# Patient Record
Sex: Female | Born: 1935 | Race: White | Hispanic: No | Marital: Married | Smoking: Former smoker
Health system: Southern US, Community
[De-identification: ages and names within clinical notes are randomized; demographics above are authoritative.]

## PROBLEM LIST (undated history)

## (undated) DIAGNOSIS — I2510 Atherosclerotic heart disease of native coronary artery without angina pectoris: Secondary | ICD-10-CM

## (undated) DIAGNOSIS — E2740 Unspecified adrenocortical insufficiency: Secondary | ICD-10-CM

## (undated) DIAGNOSIS — F419 Anxiety disorder, unspecified: Secondary | ICD-10-CM

## (undated) DIAGNOSIS — I4891 Unspecified atrial fibrillation: Secondary | ICD-10-CM

## (undated) HISTORY — PX: TONSILLECTOMY: SUR1361

## (undated) HISTORY — PX: ABDOMINAL HYSTERECTOMY: SHX81

## (undated) HISTORY — PX: APPENDECTOMY: SHX54

---

## 1998-06-10 ENCOUNTER — Inpatient Hospital Stay (HOSPITAL_COMMUNITY): Admission: RE | Admit: 1998-06-10 | Discharge: 1998-06-14 | Payer: Self-pay | Admitting: *Deleted

## 2015-11-18 ENCOUNTER — Emergency Department (HOSPITAL_COMMUNITY): Payer: Medicare Other

## 2015-11-18 ENCOUNTER — Encounter (HOSPITAL_COMMUNITY): Payer: Self-pay

## 2015-11-18 ENCOUNTER — Emergency Department (HOSPITAL_COMMUNITY)
Admission: EM | Admit: 2015-11-18 | Discharge: 2015-11-18 | Disposition: A | Discharge status: Home / Self Care | Payer: Medicare Other | Attending: Emergency Medicine | Admitting: Emergency Medicine

## 2015-11-18 DIAGNOSIS — B349 Viral infection, unspecified: Secondary | ICD-10-CM

## 2015-11-18 DIAGNOSIS — Z87891 Personal history of nicotine dependence: Secondary | ICD-10-CM | POA: Insufficient documentation

## 2015-11-18 DIAGNOSIS — Z7982 Long term (current) use of aspirin: Secondary | ICD-10-CM | POA: Diagnosis not present

## 2015-11-18 DIAGNOSIS — Z7952 Long term (current) use of systemic steroids: Secondary | ICD-10-CM | POA: Insufficient documentation

## 2015-11-18 DIAGNOSIS — I2510 Atherosclerotic heart disease of native coronary artery without angina pectoris: Secondary | ICD-10-CM | POA: Diagnosis not present

## 2015-11-18 DIAGNOSIS — Z8639 Personal history of other endocrine, nutritional and metabolic disease: Secondary | ICD-10-CM | POA: Insufficient documentation

## 2015-11-18 DIAGNOSIS — Z8659 Personal history of other mental and behavioral disorders: Secondary | ICD-10-CM | POA: Insufficient documentation

## 2015-11-18 DIAGNOSIS — Z79899 Other long term (current) drug therapy: Secondary | ICD-10-CM | POA: Insufficient documentation

## 2015-11-18 DIAGNOSIS — R509 Fever, unspecified: Secondary | ICD-10-CM | POA: Diagnosis present

## 2015-11-18 DIAGNOSIS — I4891 Unspecified atrial fibrillation: Secondary | ICD-10-CM | POA: Insufficient documentation

## 2015-11-18 HISTORY — DX: Atherosclerotic heart disease of native coronary artery without angina pectoris: I25.10

## 2015-11-18 HISTORY — DX: Anxiety disorder, unspecified: F41.9

## 2015-11-18 HISTORY — DX: Unspecified adrenocortical insufficiency: E27.40

## 2015-11-18 HISTORY — DX: Unspecified atrial fibrillation: I48.91

## 2015-11-18 LAB — CBC WITH DIFFERENTIAL/PLATELET
Basophils Absolute: 0 10*3/uL (ref 0.0–0.1)
Basophils Relative: 0 %
Eosinophils Absolute: 0 10*3/uL (ref 0.0–0.7)
Eosinophils Relative: 0 %
HEMATOCRIT: 43.6 % (ref 36.0–46.0)
HEMOGLOBIN: 14.5 g/dL (ref 12.0–15.0)
LYMPHS ABS: 0.9 10*3/uL (ref 0.7–4.0)
LYMPHS PCT: 6 %
MCH: 30.4 pg (ref 26.0–34.0)
MCHC: 33.3 g/dL (ref 30.0–36.0)
MCV: 91.4 fL (ref 78.0–100.0)
MONO ABS: 1.3 10*3/uL — AB (ref 0.1–1.0)
MONOS PCT: 9 %
NEUTROS ABS: 12 10*3/uL — AB (ref 1.7–7.7)
NEUTROS PCT: 85 %
Platelets: 171 10*3/uL (ref 150–400)
RBC: 4.77 MIL/uL (ref 3.87–5.11)
RDW: 13.5 % (ref 11.5–15.5)
WBC: 14.2 10*3/uL — ABNORMAL HIGH (ref 4.0–10.5)

## 2015-11-18 LAB — COMPREHENSIVE METABOLIC PANEL
ALBUMIN: 3.5 g/dL (ref 3.5–5.0)
ALT: 18 U/L (ref 14–54)
AST: 34 U/L (ref 15–41)
Alkaline Phosphatase: 66 U/L (ref 38–126)
Anion gap: 11 (ref 5–15)
BILIRUBIN TOTAL: 1.1 mg/dL (ref 0.3–1.2)
BUN: 21 mg/dL — AB (ref 6–20)
CO2: 23 mmol/L (ref 22–32)
Calcium: 8.6 mg/dL — ABNORMAL LOW (ref 8.9–10.3)
Chloride: 97 mmol/L — ABNORMAL LOW (ref 101–111)
Creatinine, Ser: 0.72 mg/dL (ref 0.44–1.00)
GFR calc Af Amer: >60 mL/min (ref >60)
GFR calc non Af Amer: >60 mL/min (ref >60)
GLUCOSE: 157 mg/dL — AB (ref 65–99)
POTASSIUM: 4.5 mmol/L (ref 3.5–5.1)
SODIUM: 131 mmol/L — AB (ref 135–145)
TOTAL PROTEIN: 6.5 g/dL (ref 6.5–8.1)

## 2015-11-18 LAB — URINE MICROSCOPIC-ADD ON

## 2015-11-18 LAB — URINALYSIS, ROUTINE W REFLEX MICROSCOPIC
GLUCOSE, UA: NEGATIVE mg/dL
Ketones, ur: 15 mg/dL — AB
Nitrite: POSITIVE — AB
PH: 5.5 (ref 5.0–8.0)
PROTEIN: 30 mg/dL — AB
SPECIFIC GRAVITY, URINE: 1.03 (ref 1.005–1.030)

## 2015-11-18 LAB — I-STAT CG4 LACTIC ACID, ED
LACTIC ACID, VENOUS: 1.24 mmol/L (ref 0.5–2.0)
Lactic Acid, Venous: 1.44 mmol/L (ref 0.5–2.0)

## 2015-11-18 MED ORDER — SODIUM CHLORIDE 0.9 % IV BOLUS (SEPSIS)
1000.0000 mL | Freq: Once | INTRAVENOUS | Status: AC
  Administered 2015-11-18: 1000 mL via INTRAVENOUS

## 2015-11-18 MED ORDER — SODIUM CHLORIDE 0.9 % IV SOLN
INTRAVENOUS | Status: DC
  Administered 2015-11-18: 16:00:00 via INTRAVENOUS

## 2015-11-18 NOTE — Discharge Instructions (Signed)
Viral Infections °A viral infection can be caused by different types of viruses. Most viral infections are not serious and resolve on their own. However, some infections may cause severe symptoms and may lead to further complications. °SYMPTOMS °Viruses can frequently cause: °· Minor sore throat. °· Aches and pains. °· Headaches. °· Runny nose. °· Different types of rashes. °· Watery eyes. °· Tiredness. °· Cough. °· Loss of appetite. °· Gastrointestinal infections, resulting in nausea, vomiting, and diarrhea. °These symptoms do not respond to antibiotics because the infection is not caused by bacteria. However, you might catch a bacterial infection following the viral infection. This is sometimes called a "superinfection." Symptoms of such a bacterial infection may include: °· Worsening sore throat with pus and difficulty swallowing. °· Swollen neck glands. °· Chills and a high or persistent fever. °· Severe headache. °· Tenderness over the sinuses. °· Persistent overall ill feeling (malaise), muscle aches, and tiredness (fatigue). °· Persistent cough. °· Yellow, green, or brown mucus production with coughing. °HOME CARE INSTRUCTIONS  °· Only take over-the-counter or prescription medicines for pain, discomfort, diarrhea, or fever as directed by your caregiver. °· Drink enough water and fluids to keep your urine clear or pale yellow. Sports drinks can provide valuable electrolytes, sugars, and hydration. °· Get plenty of rest and maintain proper nutrition. Soups and broths with crackers or rice are fine. °SEEK IMMEDIATE MEDICAL CARE IF:  °· You have severe headaches, shortness of breath, chest pain, neck pain, or an unusual rash. °· You have uncontrolled vomiting, diarrhea, or you are unable to keep down fluids. °· You or your child has an oral temperature above 102° F (38.9° C), not controlled by medicine. °· Your baby is older than 3 months with a rectal temperature of 102° F (38.9° C) or higher. °· Your baby is 3  months old or younger with a rectal temperature of 100.4° F (38° C) or higher. °MAKE SURE YOU:  °· Understand these instructions. °· Will watch your condition. °· Will get help right away if you are not doing well or get worse. °  °This information is not intended to replace advice given to you by your health care provider. Make sure you discuss any questions you have with your health care provider. °  °Document Released: 06/10/2005 Document Revised: 11/23/2011 Document Reviewed: 02/06/2015 °Elsevier Interactive Patient Education ©2016 [employer] ° °

## 2015-11-18 NOTE — ED Notes (Addendum)
Per EMS, Pt, from [facility], c/o fever, decreased oral intake, emesis, and elevated WBC x 2 days.  Denies pain.  Denies diarrhea.  Hx of adrenocortical insufficiency.      Pt had blood work drawn which resulted WBC 15.  Also, Pt negative for Flu A and B.

## 2015-11-18 NOTE — ED Provider Notes (Signed)
CSN: [id number]     Arrival date & time 11/18/15  1340 History   First MD Initiated Contact with Patient 11/18/15 1525     Chief Complaint  Patient presents with  . Fever  . Abnormal Lab     (Consider location/radiation/quality/duration/timing/severity/associated sxs/prior Treatment) HPI Comments: Patient here with 3 days of low-grade fever as well as decreased oral intake. Negative influenza test at the nursing home. Her blood work showed elevated white blood cell count of 15,000. She has has not cough and congestion. As well as a hoarse voice. Denies any dysuria or flank pain. No chest discomfort. Patient states that she feels as if she is getting better. Her temperature today of 102 and was treated with Tylenol. Was transported from the nursing home to here.  Patient is a 82 y.o. female presenting with fever. The history is provided by the patient and a relative.  Fever   Past Medical History  Diagnosis Date  . Atrial fibrillation (HCC)   . Anxiety disorder   . ASHD (arteriosclerotic heart disease)   . Adrenocortical insufficiency [facility](HCC)    Past Surgical History  Procedure Laterality Date  . Abdominal hysterectomy    . Appendectomy    . Tonsillectomy     History reviewed. No pertinent family history. Social History  Substance Use Topics  . Smoking status: Former Games developermoker  . Smokeless tobacco: None  . Alcohol Use: No   OB History    No data available     Review of Systems  Constitutional: Positive for fever.  All other systems reviewed and are negative.     Allergies  Ace inhibitors  Home Medications   Prior to Admission medications   Medication Sig Start Date End Date Taking? Authorizing Provider  acetaminophen (TYLENOL) 325 MG tablet Take 650 mg by mouth every 6 (six) hours as needed (for pain).   Yes Historical Provider, MD  aspirin 81 MG chewable tablet Chew 81 mg by mouth daily.   Yes Historical Provider, MD  bisacodyl (DULCOLAX) 5 MG EC tablet Take 5 mg by  mouth daily as needed for mild constipation, moderate constipation or severe constipation.   Yes Historical Provider, MD  Calcium Carbonate-Vitamin D (CALCIUM 600+D) 600-400 MG-UNIT tablet Take 1 tablet by mouth daily.   Yes Historical Provider, MD  diphenhydrAMINE (BENADRYL) 25 mg capsule Take 25 mg by mouth every 6 (six) hours as needed for itching.   Yes Historical Provider, MD  fludrocortisone (FLORINEF) 0.1 MG tablet Take 0.1 mg by mouth daily.   Yes Historical Provider, MD  levothyroxine (SYNTHROID, LEVOTHROID) 88 MCG tablet Take 88 mcg by mouth daily before breakfast.   Yes Historical Provider, MD  losartan (COZAAR) 50 MG tablet Take 50 mg by mouth every evening.   Yes Historical Provider, MD  Melatonin 3 MG TABS Take 1 tablet by mouth every evening.   Yes Historical Provider, MD  metoprolol (LOPRESSOR) 50 MG tablet Take 75 mg by mouth 2 (two) times daily.   Yes Historical Provider, MD  nitroGLYCERIN (NITROSTAT) 0.4 MG SL tablet Place 0.4 mg under the tongue every 5 (five) minutes as needed for chest pain.   Yes Historical Provider, MD  oxyCODONE (OXY IR/ROXICODONE) 5 MG immediate release tablet Take 5 mg by mouth every 4 (four) hours as needed for severe pain.   Yes Historical Provider, MD  potassium chloride SA (K-DUR,KLOR-CON) 20 MEQ tablet Take 20 mEq by mouth daily.   Yes Historical Provider, MD  predniSONE (DELTASONE) 2.5 MG tablet  Take 12.5 mg by mouth daily with breakfast.   Yes Historical Provider, MD  senna-docusate (SENEXON-S) 8.6-50 MG tablet Take 2 tablets by mouth daily as needed for mild constipation.   Yes Historical Provider, MD   BP 122/81 mmHg  Pulse 88  Temp(Src) 98 F (36.7 C) (Oral)  Resp 16  Ht  (1.676 m)  Wt 47.628 kg  BMI 16.96 kg/m2  SpO2 94% Physical Exam  Constitutional: She is oriented to person, place, and time. She appears well-developed and well-nourished.  Non-toxic appearance. No distress.  HENT:  Head: Normocephalic and atraumatic.  Eyes:  Conjunctivae, EOM and lids are normal. Pupils are equal, round, and reactive to light.  Neck: Normal range of motion. Neck supple. No tracheal deviation present. No thyroid mass present.  Cardiovascular: Normal rate, regular rhythm and normal heart sounds.  Exam reveals no gallop.   No murmur heard. Pulmonary/Chest: Effort normal and breath sounds normal. No stridor. No respiratory distress. She has no decreased breath sounds. She has no wheezes. She has no rhonchi. She has no rales.  Abdominal: Soft. Normal appearance and bowel sounds are normal. She exhibits no distension. There is no tenderness. There is no rebound and no CVA tenderness.  Musculoskeletal: Normal range of motion. She exhibits no edema or tenderness.  Neurological: She is alert and oriented to person, place, and time. She has normal strength. No cranial nerve deficit or sensory deficit. GCS eye subscore is 4. GCS verbal subscore is 5. GCS motor subscore is 6.  Skin: Skin is warm and dry. No abrasion and no rash noted.  Psychiatric: She has a normal mood and affect. Her speech is normal and behavior is normal.  Nursing note and vitals reviewed.   ED Course  Procedures (including critical care time) Labs Review Labs Reviewed  COMPREHENSIVE METABOLIC PANEL - Abnormal; Notable for the following:    Sodium 131 (*)    Chloride 97 (*)    Glucose, Bld 157 (*)    BUN 21 (*)    Calcium 8.6 (*)    All other components within normal limits  CBC WITH DIFFERENTIAL/PLATELET - Abnormal; Notable for the following:    WBC 14.2 (*)    Neutro Abs 12.0 (*)    Monocytes Absolute 1.3 (*)    All other components within normal limits  URINE CULTURE  CULTURE, BLOOD (ROUTINE X 2)  CULTURE, BLOOD (ROUTINE X 2)  URINALYSIS, ROUTINE W REFLEX MICROSCOPIC (NOT AT [facility])  I-STAT CG4 LACTIC ACID, ED    Imaging Review Dg Chest 2 View  11/18/2015  CLINICAL DATA:  Fever vomiting elevated white blood count EXAM: CHEST  2 VIEW COMPARISON:  None.  FINDINGS: Moderate sigmoid scoliotic curvature of the thoracolumbar spine. Moderate cardiac enlargement. Vascular pattern is normal. No evidence of infiltrate for effusion. IMPRESSION: No active cardiopulmonary disease. Electronically Signed   By: Esperanza Heir M.D.   On: 11/18/2015 14:50   I have personally reviewed and evaluated these images and lab results as part of my medical decision-making.   EKG Interpretation None      MDM   Final diagnoses:  None    Patient urinalysis results noted. Chest x-ray results noted as well 2. Patient states overall she is feeling better. Suspect that she has a viral illness. Urine culture sent and patient be discharged home    Lorre Nick, MD 11/18/15 Rickey Primus

## 2015-11-18 NOTE — Progress Notes (Signed)
Patient is resident at [facility].  Patient's pcp is Dr. Ivery Qualeaniel Patterson.  System updated.

## 2015-11-19 LAB — URINE CULTURE: Culture: NO GROWTH

## 2015-11-23 LAB — CULTURE, BLOOD (ROUTINE X 2)
CULTURE: NO GROWTH
Culture: NO GROWTH

## 2017-02-12 DEATH — deceased

## 2017-09-14 IMAGING — CR DG CHEST 2V
2 series · 2 of 2 positions shown · non-contrast
Comparison: None.

CLINICAL DATA: Fever vomiting elevated white blood count

EXAM:
CHEST  2 VIEW

[w chest lat]
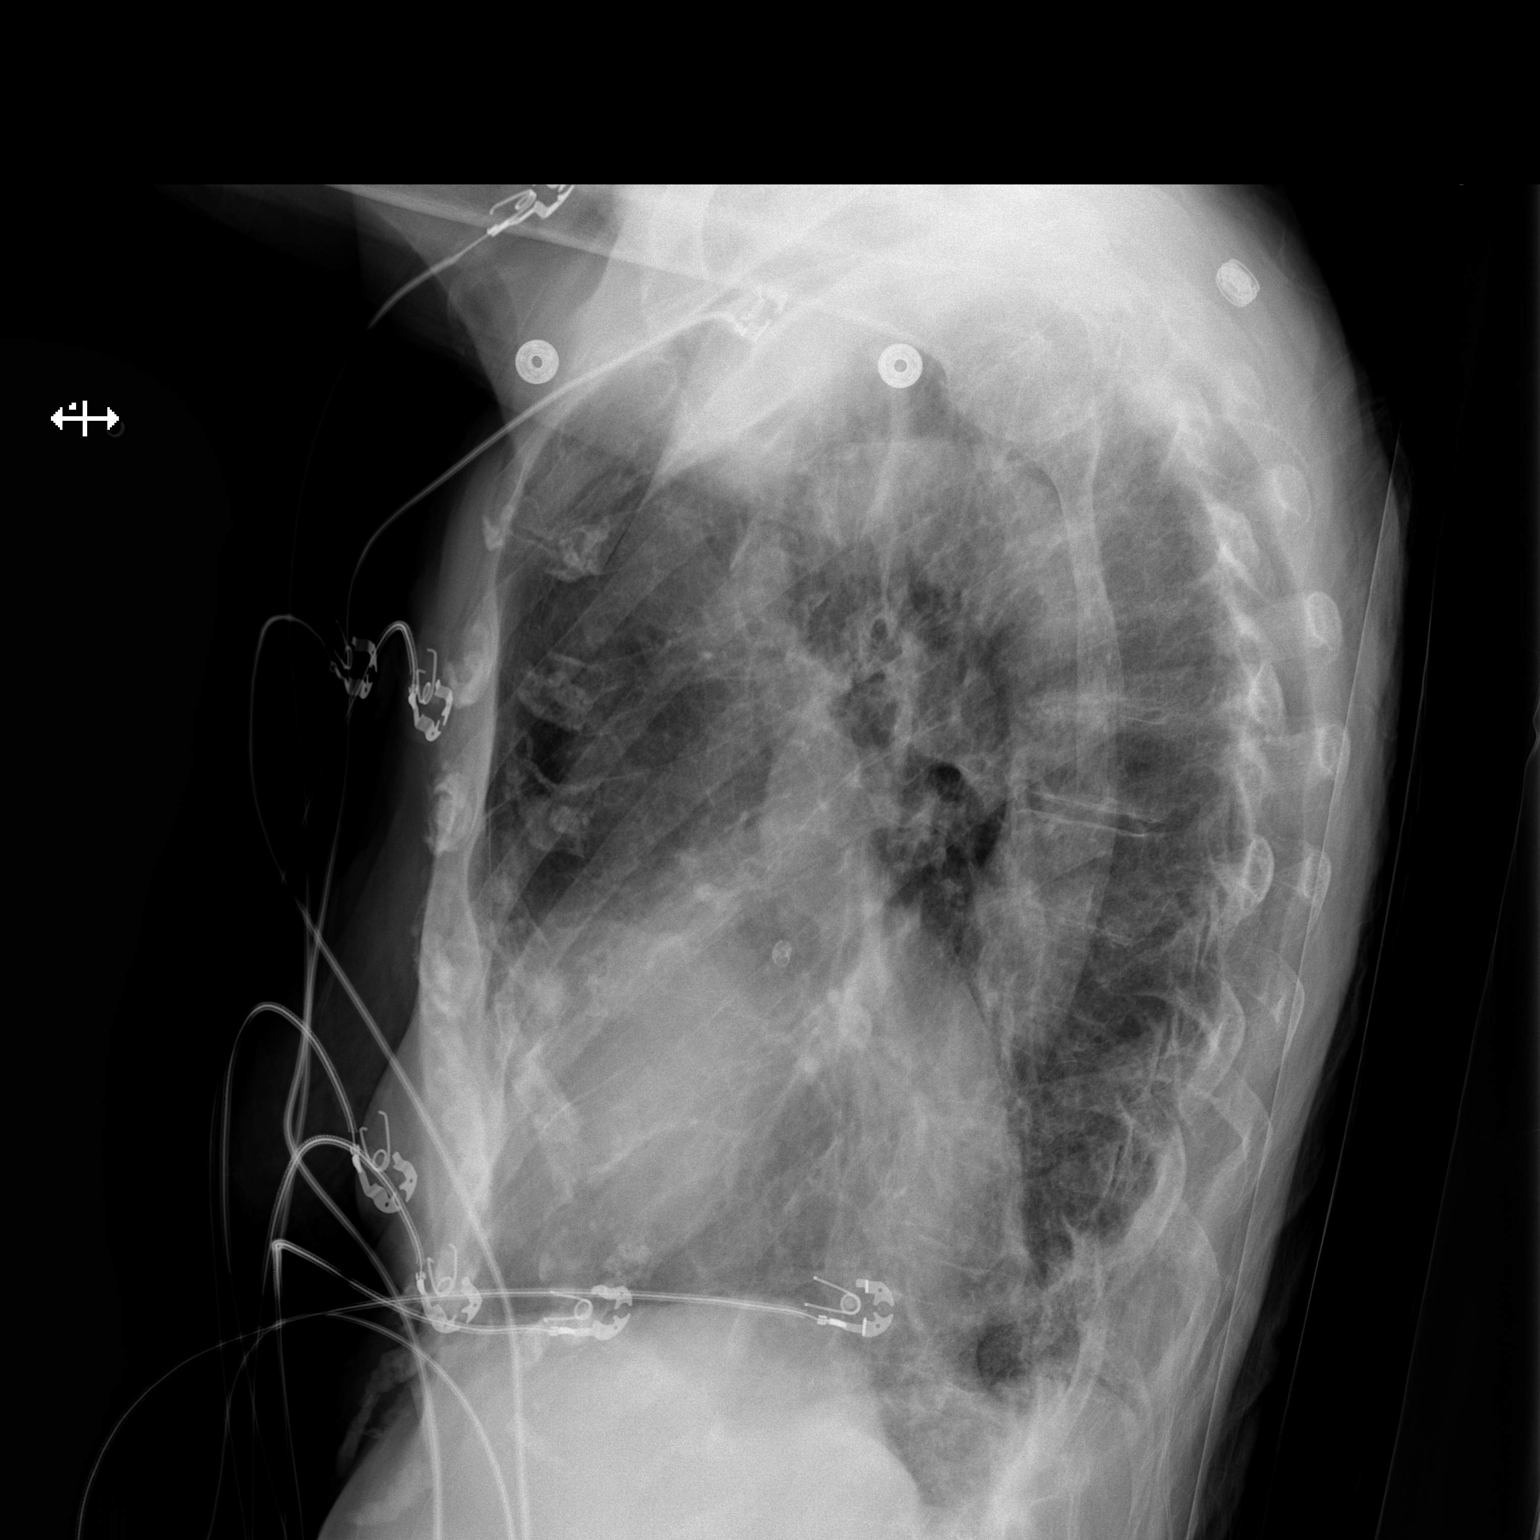

[x chest ap]
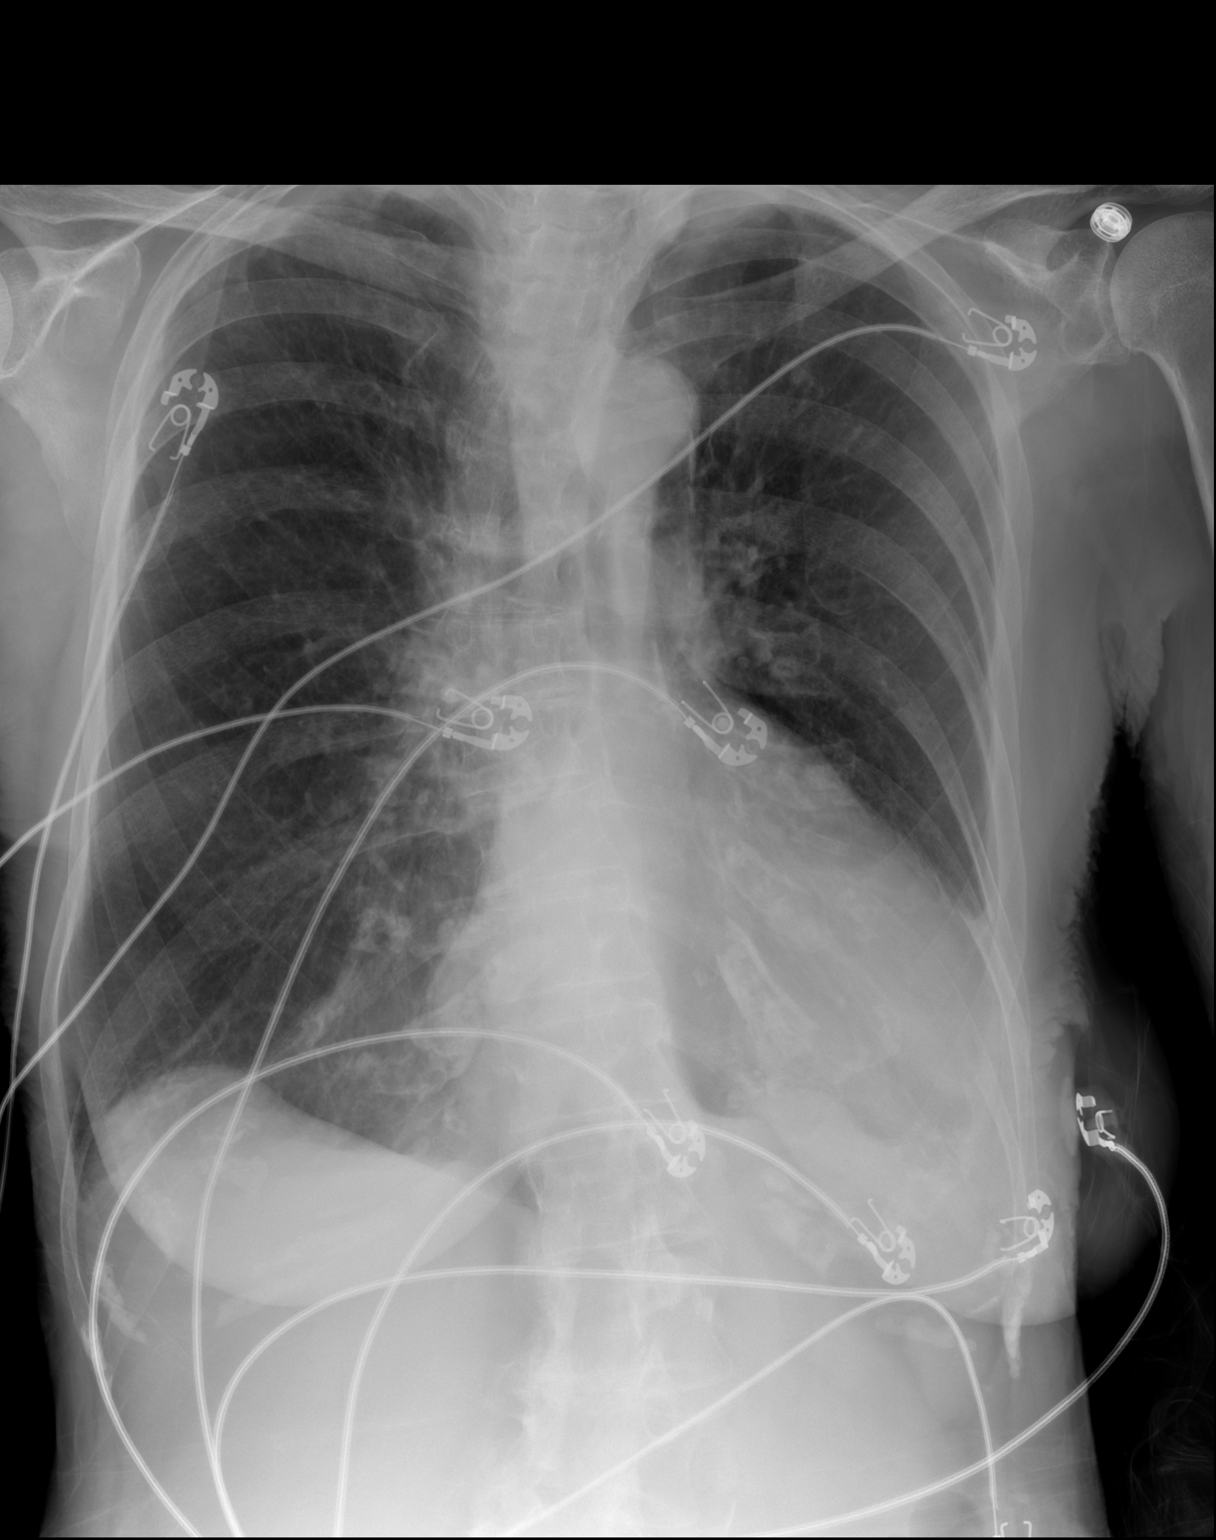

[2 of 2 positions shown; findings below may reference images not displayed]

FINDINGS: Moderate sigmoid scoliotic curvature of the thoracolumbar spine.
Moderate cardiac enlargement. Vascular pattern is normal. No
evidence of infiltrate for effusion.
IMPRESSION: No active cardiopulmonary disease.
